# Patient Record
Sex: Female | Born: 2005 | Race: White | Hispanic: No | Marital: Single | Smoking: Never smoker
Health system: Southern US, Community
[De-identification: ages and names within clinical notes are randomized; demographics above are authoritative.]

## PROBLEM LIST (undated history)

## (undated) DIAGNOSIS — K219 Gastro-esophageal reflux disease without esophagitis: Secondary | ICD-10-CM

## (undated) DIAGNOSIS — J45909 Unspecified asthma, uncomplicated: Secondary | ICD-10-CM

## (undated) DIAGNOSIS — R519 Headache, unspecified: Secondary | ICD-10-CM

## (undated) DIAGNOSIS — F32A Depression, unspecified: Secondary | ICD-10-CM

## (undated) DIAGNOSIS — F419 Anxiety disorder, unspecified: Secondary | ICD-10-CM

## (undated) HISTORY — DX: Anxiety disorder, unspecified: F41.9

## (undated) HISTORY — DX: Depression, unspecified: F32.A

## (undated) HISTORY — PX: OTHER SURGICAL HISTORY: SHX169

## (undated) HISTORY — DX: Gastro-esophageal reflux disease without esophagitis: K21.9

## (undated) HISTORY — DX: Unspecified asthma, uncomplicated: J45.909

## (undated) HISTORY — DX: Headache, unspecified: R51.9

---
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
## 2023-01-02 ENCOUNTER — Encounter (INDEPENDENT_AMBULATORY_CARE_PROVIDER_SITE_OTHER): Payer: Self-pay | Admitting: Family

## 2023-04-02 ENCOUNTER — Other Ambulatory Visit (INDEPENDENT_AMBULATORY_CARE_PROVIDER_SITE_OTHER): Payer: Self-pay | Admitting: Family

## 2023-04-02 DIAGNOSIS — G43009 Migraine without aura, not intractable, without status migrainosus: Secondary | ICD-10-CM

## 2023-05-02 ENCOUNTER — Other Ambulatory Visit (INDEPENDENT_AMBULATORY_CARE_PROVIDER_SITE_OTHER): Payer: Self-pay

## 2023-05-02 DIAGNOSIS — G43009 Migraine without aura, not intractable, without status migrainosus: Secondary | ICD-10-CM

## 2023-05-05 ENCOUNTER — Ambulatory Visit (INDEPENDENT_AMBULATORY_CARE_PROVIDER_SITE_OTHER): Payer: PRIVATE HEALTH INSURANCE | Admitting: Family

## 2023-05-05 ENCOUNTER — Encounter (INDEPENDENT_AMBULATORY_CARE_PROVIDER_SITE_OTHER): Payer: Self-pay | Admitting: Family

## 2023-05-05 VITALS — BP 104/74 | HR 84 | Ht 67.13 in | Wt 167.2 lb

## 2023-05-05 DIAGNOSIS — G43009 Migraine without aura, not intractable, without status migrainosus: Secondary | ICD-10-CM

## 2023-05-05 DIAGNOSIS — R42 Dizziness and giddiness: Secondary | ICD-10-CM

## 2023-05-05 DIAGNOSIS — F419 Anxiety disorder, unspecified: Secondary | ICD-10-CM

## 2023-05-05 DIAGNOSIS — G44219 Episodic tension-type headache, not intractable: Secondary | ICD-10-CM | POA: Diagnosis not present

## 2023-05-05 DIAGNOSIS — F32A Depression, unspecified: Secondary | ICD-10-CM

## 2023-05-05 MED ORDER — TOPIRAMATE ER 50 MG PO SPRINKLE CAP24: EXTENDED_RELEASE_CAPSULE | ORAL | 5 refills | Status: DC

## 2023-05-05 NOTE — Patient Instructions (Addendum)
It was a pleasure to see you today!  Instructions for you until your next appointment are as follows: Look up [email or web address] - you will find information about the Emgality medication, patient assistance helps etc If you decide that you want to try Emgality, let me know.  Continue your medications as prescribed for now Work on having salty snacks, increasing water intake and exercise to help with the dizzy episodes Please sign up for MyChart if you have not done so. Please plan to return for follow up in 6 months or sooner if needed.  Feel free to contact our office during normal business hours at [phone] with questions or concerns. If there is no answer or the call is outside business hours, please leave a message and our clinic staff will call you back within the next business day.  If you have an urgent concern, please stay on the line for our after-hours answering service and ask for the on-call neurologist.     I also encourage you to use MyChart to communicate with me more directly. If you have not yet signed up for MyChart within [facility], the front desk staff can help you. However, please note that this inbox is NOT monitored on nights or weekends, and response can take up to 2 business days.  Urgent matters should be discussed with the on-call pediatric neurologist.   [facility], we are committed to providing exceptional care. You will receive a patient satisfaction survey through text or email regarding your visit today. Your opinion is important to me. Comments are appreciated.

## 2023-05-05 NOTE — Progress Notes (Signed)
Brenda Hobbs   MRN:  [id number]  April 28, 2006   Provider: Elveria Rising NP-C Location of Care: [facility]  Visit type: Return visit  Last visit: 12/30/2022  Referral source: Benard Rink, PA-C History from: Epic chart, patient and her mother  Brief history:  Copied from previous record: History of migraine and tension headaches, as well as anxiety and depression. She is followed by a psychiatrist and a psychologist for her mood disorder. She is taking and tolerating Remeron for abdominal pain, and Topiramate ER for migraines. She has Sumatriptan for abortive migraine treatment.   Today's concerns: She reports today that she has had more migraine headaches since her last visit. Her mother believes that they may be triggered by stress of a busy schedule.  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  Brenda Hobbs and her mother are interested in other options for reducing how often migraines occur Brenda Hobbs also reports episodes of dizziness, typically occurring when moving from a sitting to standing position. She reports that she drinks a great deal of water each day. She has not been exercising other than walking to classes on campus.  [name] has been otherwise generally healthy since she was last seen. No health concerns today other than previously mentioned.  Review of systems: Please see HPI for neurologic and other pertinent review of systems. Otherwise all other systems were reviewed and were negative.  Problem List: Patient Active Problem List   Diagnosis Date Noted   Nausea with vomiting 01/06/2021   Abdominal pain 01/06/2021   Migraine without aura and without status migrainosus, not intractable 10/10/2018   Episodic tension-type headache, not intractable 10/10/2018   Anxiety and depression 10/10/2018   Neck  pain, chronic 10/10/2018   Temporomandibular joint dysfunction 10/10/2018     Past Medical History:  Diagnosis Date   Anxiety    Phreesia 10/31/2019   Asthma    Phreesia 10/31/2019   Depression    Phreesia 10/31/2019   GERD (gastroesophageal reflux disease)    Phreesia 10/31/2019   Headache     Past medical history comments: See HPI Copied from previous record: gastroesophageal reflux which often causes her to be nauseated even when she does not have headache. episodes of orthostatic hypotension where she will become lightheaded if she goes suddenly from lying to sitting to standing or sitting to standing  She had 2 episodes in the past that may well have been concussions; the first occurred in 2010, the second occurred in 2012 Birth History 7 lbs. 3 oz. infant born at [redacted] weeks gestational age to a 19 year old g 1 p 0 female. Gestation was complicated by maternal hypotension with near syncope Mother received IV medication pain meds Normal spontaneous vaginal delivery Nursery Course was uncomplicated, she was exclusively breast-fed, had mild jaundice that did not require phototherapy Growth and Development was recalled as  normal   Behavior History Anxiety and depression, in the care of a psychiatrist and psychologist.  Surgical history: Past Surgical History:  Procedure Laterality Date   alopecia areata     WISDOM TOOTH EXTRACTION  04/2020    Family history: family history includes Migraines in her maternal aunt and maternal grandmother.   Social history: Social History   Socioeconomic History   Marital status: Single    Spouse name: Not on file   Number of children:  Not on file   Years of education: Not on file   Highest education level: Not on file  Occupational History   Not on file  Tobacco Use   Smoking status: Never    Passive exposure: Never   Smokeless tobacco: Never  Substance and Sexual Activity   Alcohol use: Not on file   Drug use: Not on file    Sexual activity: Not on file  Other Topics Concern   Not on file  Social History Narrative   [name] is in 12th grade for the 23-24 school year.   She attends [employer].   She is not yet sure what she would like to do after graduation   [name] lives with both parents. She has two brothers.   She enjoys baking, playing the drums and music.   Social Drivers of Corporate investment banker Strain: Not on file  Food Insecurity: Not on file  Transportation Needs: Not on file  Physical Activity: Not on file  Stress: Not on file  Social Connections: Not on file  Intimate Partner Violence: Not on file    Past/failed meds: Copied from previous record: Duloxetine - ineffective   Allergies: Allergies  Allergen Reactions   Omeprazole Rash   Other Hives, Itching and Rash    Tomatoes, cantaloupe, Plums, pears, bananas, chocolate, causes stinging in mouth   Strawberry Extract Hives   Augmentin [Amoxicillin-Pot Clavulanate] Hives and Rash    Immunizations:  There is no immunization history on file for this patient.   Diagnostics/Screenings:  Physical Exam: BP 104/74 (BP Location: Left Arm, Patient Position: Sitting, Cuff Size: Normal)   Pulse 84   Ht 5' 7.13" (1.705 m)   Wt 167 lb 3.2 oz (75.8 kg)   LMP 05/05/2023 (Exact Date)   BMI 26.09 kg/m   General: well developed, well nourished young woman, seated on exam table in no evident distress Head: normocephalic and atraumatic. No dysmorphic features. Neck: supple Musculoskeletal: No skeletal deformities or obvious scoliosis Skin: no rashes or neurocutaneous lesions  Neurologic Exam Mental Status: Awake and fully alert.  Attention span, concentration, and fund of knowledge appropriate for age.  Speech fluent without dysarthria.  Able to follow commands and participate in examination. Cranial Nerves: Turns to localize faces, objects and sounds in the periphery. Facial sensation intact.  Face, tongue, palate move  normally and symmetrically. Motor: Normal functional bulk, tone and strength Sensory: Intact to touch and temperature in all extremities. Coordination: Finger-to-nose and heel-to-shin intact bilaterally. Balance adequate Gait and Station: Arises from chair, without difficulty. Stance is normal.  Gait demonstrates normal stride length and balance.   Impression: Migraine without aura and without status migrainosus, not intractable  Episodic tension-type headache, not intractable  Anxiety and depression  Dizziness   Recommendations for plan of care: The patient's previous Epic records were reviewed. No recent diagnostic studies to be reviewed with the patient. I talked with Brenda and her mother about her headaches and recommended that she consider CGRP medications such as Emgality for migraine prevention. After discussion, she and Mom agreed to research this medication and will let me know if she wants to try it.   For her dizziness, I reminded her of the need to be well hydrated and to have salty snacks during the day to help keep her blood pressure regulated. I also suggested that she ask her psychiatrist about checking her Lamotrigine level.   Plan until next visit: Continue medications as prescribed  Reminded to avoid  skipping meals, to drink plenty of water each day and to get at least 8 hours of sleep each night Call for questions or concerns Return in about 6 months (around 11/03/2023).  The medication list was reviewed and reconciled. No changes were made in the prescribed medications today. A complete medication list was provided to the patient.  Allergies as of 05/05/2023       Reactions   Omeprazole Rash   Other Hives, Itching, Rash   Tomatoes, cantaloupe, Plums, pears, bananas, chocolate, causes stinging in mouth   Strawberry Extract Hives   Augmentin [amoxicillin-pot Clavulanate] Hives, Rash        Medication List        Accurate as of May 05, 2023 11:59 PM.  If you have any questions, ask your nurse or doctor.          albuterol 108 (90 Base) MCG/ACT inhaler Commonly known as: VENTOLIN HFA SMARTSIG:2 Puff(s) By Mouth Every 4-6 Hours PRN   ALBUTEROL IN Inhale into the lungs.   clobetasol 0.05 % external solution Commonly known as: TEMOVATE Apply topically 2 (two) times daily.   Flonase Allergy Relief 50 MCG/ACT nasal spray Generic drug: fluticasone 1 spray in each nostril Nasally Once a day for 30 days   LamoTRIgine 200 MG Tb24 24 hour tablet Take by mouth.   levocetirizine 5 MG tablet Commonly known as: XYZAL Take by mouth.   mirtazapine 7.5 MG tablet Commonly known as: REMERON TAKE 1 TABLET BY MOUTH AT BEDTIME   montelukast 10 MG tablet Commonly known as: SINGULAIR TAKE 1 TABLET BY MOUTH ONCE DAILY FOR 30 DAYS   PROBIOTIC PO Take by mouth.   SUMAtriptan 50 MG tablet Commonly known as: IMITREX Take 1 tablet (50 mg total) by mouth every 2 (two) hours as needed for migraine. May repeat in 2 hours if headache persists or recurs. Do not take more than 2 doses in a 24 hour period   topiramate ER 50 MG Cs24 sprinkle capsule Commonly known as: QUDEXY XR TAKE THREE CAPSULES BY MOUTH ONCE EVERY NIGHT AT BEDTIME      Total time spent with the patient was 25 minutes, of which 50% or more was spent in counseling and coordination of care.  Elveria Rising NP-C [facility] [address], [address] [place], [place] [address] Ph. [phone] Fax [phone]

## 2023-05-06 ENCOUNTER — Encounter (INDEPENDENT_AMBULATORY_CARE_PROVIDER_SITE_OTHER): Payer: Self-pay | Admitting: Family

## 2023-05-06 DIAGNOSIS — R42 Dizziness and giddiness: Secondary | ICD-10-CM | POA: Insufficient documentation

## 2023-11-02 NOTE — Progress Notes (Signed)
 Brenda Hobbs   MRN:  [id number]  01-09-2006   Provider: Ellouise Bollman NP-C Location of Care: [facility]  Visit type: Return visit  Last visit: 05/05/2023  Referral source: Gladis Moats, PA-C History from: Epic chart, patient and her mother  Brief history:  Copied from previous record: History of migraine and tension headaches, as well as anxiety and depression. She is followed by a psychiatrist and a psychologist for her mood disorder. She is taking and tolerating Remeron  for abdominal migraine pain, and Topiramate  ER for migraines. She has Sumatriptan  for abortive migraine treatment.    Today's concerns: She reports today that headaches have not been problematic since her last visit. She is in college and says that things are going well. She is interested in stopping Topiramate  today Anasofia has been taking Lamotrigine for mood disorder but reports today that this has been tapered and discontinued since her last visit.  She asked if I would prescribe Remeron  as she is no longer seeing the pediatric provider that initiated it.  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  Myrle has been otherwise generally healthy since she was last seen. No health concerns today other than previously mentioned.  Review of systems: Please see HPI for neurologic and other pertinent review of systems. Otherwise all other systems were reviewed and were negative.  Problem List: Patient Active Problem List   Diagnosis Date Noted   Dizziness 05/06/2023   Nausea with vomiting 01/06/2021   Abdominal pain 01/06/2021   Migraine without aura and without status migrainosus, not intractable 10/10/2018   Episodic tension-type headache, not intractable 10/10/2018   Anxiety and depression 10/10/2018   Neck pain, chronic 10/10/2018   Temporomandibular joint dysfunction 10/10/2018      Past Medical History:  Diagnosis Date   Anxiety    Phreesia 10/31/2019   Asthma    Phreesia 10/31/2019   Depression    Phreesia 10/31/2019   GERD (gastroesophageal reflux disease)    Phreesia 10/31/2019   Headache     Past medical history comments: See HPI Copied from previous record: gastroesophageal reflux which often causes her to be nauseated even when she does not have headache. episodes of orthostatic hypotension where she will become lightheaded if she goes suddenly from lying to sitting to standing or sitting to standing  She had 2 episodes in the past that may well have been concussions; the first occurred in 2010, the second occurred in 2012 Birth History 7 lbs. 3 oz. infant born at [redacted] weeks gestational age to a 20 year old g 1 p 0 female. Gestation was complicated by maternal hypotension with near syncope Mother received IV medication pain meds Normal spontaneous vaginal delivery Nursery Course was uncomplicated, she was exclusively breast-fed, had mild jaundice that did not require phototherapy Growth and Development was recalled as  normal   Behavior History Anxiety and depression, in the care of a psychiatrist and psychologist.  Surgical history: Past Surgical History:  Procedure Laterality Date   alopecia areata     WISDOM TOOTH EXTRACTION  04/2020     Family history: family history includes Migraines in her maternal aunt and maternal grandmother.   Social history: Social History   Socioeconomic History   Marital status: Single    Spouse name: Not on file   Number of children: Not on file   Years of education: Not on file  Highest education level: Not on file  Occupational History   Not on file  Tobacco Use   Smoking status: Never    Passive exposure: Never   Smokeless tobacco: Never  Substance and Sexual Activity   Alcohol use: Not on file   Drug use: Not on file   Sexual activity: Not on file  Other Topics Concern   Not on file  Social  History Narrative   Freshman in college.    [employer]   Glenda lives with both parents. She has two brothers.   She enjoys baking, playing the drums and music.   Social Drivers of Corporate investment banker Strain: Not on file  Food Insecurity: Not on file  Transportation Needs: Not on file  Physical Activity: Not on file  Stress: Not on file  Social Connections: Not on file  Intimate Partner Violence: Not on file    Past/failed meds: Copied from previous record: Duloxetine - ineffective   Allergies: Allergies  Allergen Reactions   Omeprazole Rash   Other Hives, Itching and Rash    Tomatoes, cantaloupe, Plums, pears, bananas, chocolate, causes stinging in mouth   Strawberry Extract Hives   Augmentin [Amoxicillin-Pot Clavulanate] Hives and Rash    Immunizations:  There is no immunization history on file for this patient.   Diagnostics/Screenings:  Physical Exam: BP 116/78   Pulse 82   Ht 5' 7.5 (1.715 m)   Wt 166 lb 9.6 oz (75.6 kg)   SpO2 97%   BMI 25.71 kg/m   General: Well developed, well nourished adolescent girl, seated on exam table, in no evident distress Head: Head normocephalic and atraumatic.  Oropharynx benign. Neck: Supple Cardiovascular: Regular rate and rhythm, no murmurs Respiratory: Breath sounds clear to auscultation Musculoskeletal: No obvious deformities or scoliosis Skin: No rashes or neurocutaneous lesions  Neurologic Exam Mental Status: Awake and fully alert.  Oriented to place and time.  Recent and remote memory intact.  Attention span, concentration, and fund of knowledge appropriate.  Mood and affect appropriate. Cranial Nerves: Fundoscopic exam reveals sharp disc margins.  Pupils equal, briskly reactive to light.  Extraocular movements full without nystagmus. Hearing intact and symmetric to whisper.  Facial sensation intact.  Face tongue, palate move normally and symmetrically. Shoulder shrug  normal Motor: Normal bulk and tone. Normal strength in all tested extremity muscles. Sensory: Intact to touch and temperature in all extremities.  Coordination: Rapid alternating movements normal in all extremities.  Finger-to-nose and heel-to shin performed accurately bilaterally.  Romberg negative. Gait and Station: Arises from chair without difficulty.  Stance is normal. Gait demonstrates normal stride length and balance.   Able to heel, toe and tandem walk without difficulty.  Impression: Migraine without aura and without status migrainosus, not intractable - Plan: topiramate  ER (QUDEXY  XR) 50 MG CS24 sprinkle capsule  Nausea and vomiting, unspecified vomiting type - Plan: mirtazapine  (REMERON ) 30 MG tablet  Epigastric pain - Plan: mirtazapine  (REMERON ) 30 MG tablet  Episodic tension-type headache, not intractable  Anxiety and depression   Recommendations for plan of care: The patient's previous Epic records were reviewed. No recent diagnostic studies to be reviewed with the patient. I talked with Deandrea and her mother about her headaches. I agree with stopping Topiramate  and gave her instructions on how to do so.  Plan until next visit: Taper instructions for Topiramate  given Remeron  refilled  Call for questions or concerns Return in about 6 months (around 05/04/2024).  The medication list  was reviewed and reconciled. I reviewed the changes that were made in the prescribed medications today. A complete medication list was provided to the patient.  Allergies as of 11/03/2023       Reactions   Omeprazole Rash   Other Hives, Itching, Rash   Tomatoes, cantaloupe, Plums, pears, bananas, chocolate, causes stinging in mouth   Strawberry Extract Hives   Augmentin [amoxicillin-pot Clavulanate] Hives, Rash        Medication List        Accurate as of November 03, 2023 11:59 PM. If you have any questions, ask your nurse or doctor.          STOP taking these medications     clobetasol 0.05 % external solution Commonly known as: TEMOVATE Stopped by: Ellouise Marianna Rankin Allergy Relief 50 MCG/ACT nasal spray Generic drug: fluticasone Stopped by: Ellouise Marianna   LamoTRIgine 200 MG Tb24 24 hour tablet Stopped by: Ellouise Marianna   montelukast 10 MG tablet Commonly known as: SINGULAIR Stopped by: Ellouise Marianna   PROBIOTIC PO Stopped by: Ellouise Marianna       TAKE these medications    albuterol 108 (90 Base) MCG/ACT inhaler Commonly known as: VENTOLIN HFA SMARTSIG:2 Puff(s) By Mouth Every 4-6 Hours PRN   ALBUTEROL IN Inhale into the lungs.   levocetirizine 5 MG tablet Commonly known as: XYZAL Take by mouth.   mirtazapine  30 MG tablet Commonly known as: Remeron  Take 1 tablet (30 mg total) by mouth at bedtime. What changed:  medication strength how much to take Changed by: Ellouise Marianna   SUMAtriptan  50 MG tablet Commonly known as: IMITREX  Take 1 tablet (50 mg total) by mouth every 2 (two) hours as needed for migraine. May repeat in 2 hours if headache persists or recurs. Do not take more than 2 doses in a 24 hour period   topiramate  ER 50 MG Cs24 sprinkle capsule Commonly known as: QUDEXY  XR TAKE TWO CAPSULES BY MOUTH AT BEDTIME FOR THREE DAYS, THEN TAKE 1 CAPSULE BY MOUTH AT BEDTIME FOR THREE DAYS, THEN STOP THE MEDICATION What changed: additional instructions Changed by: Ellouise Marianna      Total time spent with the patient was 30 minutes, of which 50% or more was spent in counseling and coordination of care.  Ellouise Marianna NP-C [facility] [address] [place], [place] [address] Ph. [phone] Fax [phone]

## 2023-11-03 ENCOUNTER — Ambulatory Visit (INDEPENDENT_AMBULATORY_CARE_PROVIDER_SITE_OTHER): Payer: PRIVATE HEALTH INSURANCE | Admitting: Family

## 2023-11-03 ENCOUNTER — Encounter (INDEPENDENT_AMBULATORY_CARE_PROVIDER_SITE_OTHER): Payer: Self-pay | Admitting: Family

## 2023-11-03 VITALS — BP 116/78 | HR 82 | Ht 67.5 in | Wt 166.6 lb

## 2023-11-03 DIAGNOSIS — G44219 Episodic tension-type headache, not intractable: Secondary | ICD-10-CM | POA: Diagnosis not present

## 2023-11-03 DIAGNOSIS — R112 Nausea with vomiting, unspecified: Secondary | ICD-10-CM

## 2023-11-03 DIAGNOSIS — F419 Anxiety disorder, unspecified: Secondary | ICD-10-CM

## 2023-11-03 DIAGNOSIS — G43009 Migraine without aura, not intractable, without status migrainosus: Secondary | ICD-10-CM

## 2023-11-03 DIAGNOSIS — F32A Depression, unspecified: Secondary | ICD-10-CM

## 2023-11-03 DIAGNOSIS — R1013 Epigastric pain: Secondary | ICD-10-CM | POA: Diagnosis not present

## 2023-11-03 MED ORDER — TOPIRAMATE ER 50 MG PO SPRINKLE CAP24: EXTENDED_RELEASE_CAPSULE | ORAL | Status: DC

## 2023-11-03 MED ORDER — MIRTAZAPINE 30 MG PO TABS: 30.0000 mg | ORAL_TABLET | Freq: Every day | ORAL | Status: DC

## 2023-11-03 NOTE — Patient Instructions (Signed)
 It was a pleasure to see you today!  Instructions for you until your next appointment are as follows: Decrease Topiramate  to 2 capsules at bedtime for 3 days, then take 1 capsule at bedtime for 3 days, then stop the medication Let me know when you need the Remeron  refilled Let me know if your headaches become more frequent or more severe Please sign up for MyChart if you have not done so. Please plan to return for follow up in 6 months or sooner if needed.  Feel free to contact our office during normal business hours at [phone] with questions or concerns. If there is no answer or the call is outside business hours, please leave a message and our clinic staff will call you back within the next business day.  If you have an urgent concern, please stay on the line for our after-hours answering service and ask for the on-call neurologist.     I also encourage you to use MyChart to communicate with me more directly. If you have not yet signed up for MyChart within [facility], the front desk staff can help you. However, please note that this inbox is NOT monitored on nights or weekends, and response can take up to 2 business days.  Urgent matters should be discussed with the on-call pediatric neurologist.   At [facility], we are committed to providing exceptional care. You will receive a patient satisfaction survey through text or email regarding your visit today. Your opinion is important to me. Comments are appreciated.

## 2023-11-05 ENCOUNTER — Encounter (INDEPENDENT_AMBULATORY_CARE_PROVIDER_SITE_OTHER): Payer: Self-pay | Admitting: Family

## 2024-01-31 ENCOUNTER — Encounter (INDEPENDENT_AMBULATORY_CARE_PROVIDER_SITE_OTHER): Payer: Self-pay

## 2024-01-31 DIAGNOSIS — G43009 Migraine without aura, not intractable, without status migrainosus: Secondary | ICD-10-CM

## 2024-01-31 MED ORDER — SUMATRIPTAN SUCCINATE 50 MG PO TABS: 50.0000 mg | ORAL_TABLET | ORAL | 3 refills | Status: DC | PRN

## 2024-02-15 ENCOUNTER — Encounter (INDEPENDENT_AMBULATORY_CARE_PROVIDER_SITE_OTHER): Payer: Self-pay

## 2024-02-16 ENCOUNTER — Encounter (INDEPENDENT_AMBULATORY_CARE_PROVIDER_SITE_OTHER): Payer: Self-pay

## 2024-04-24 ENCOUNTER — Encounter (INDEPENDENT_AMBULATORY_CARE_PROVIDER_SITE_OTHER): Payer: Self-pay

## 2024-05-06 NOTE — Progress Notes (Unsigned)
 "  Brenda Hobbs   MRN:  [id number]  [birth year]   Provider: Ellouise Bollman NP-C Location of Care: [facility]  Visit type: Return visit  Last visit: 11/03/2023  Referral source: Gladis Moats, PA-C PCP: Gladis Moats, PA-C History from: Epic chart, patient and her mother  Brief history:  Copied from previous record: History of migraine and tension headaches, as well as anxiety and depression. She is followed by a psychiatrist and a psychologist for her mood disorder. She was prescribed Remeron  for abdominal migraine pain, and Topiramate  ER for migraines. She has Sumatriptan  for abortive migraine treatment.   Since last visit: Brenda Hobbs reports tapering off Topiramate  ER earlier this year without increase in migraine frequency. She recalls 2 migraines that required treatment with Sumatriptan  and sleep. She has near daily tension headaches that are not severe. She manages these with rest, muscle stretches and massage.  Brenda Hobbs reports that her first semester in college went well. 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  She was prescribed Remeron  for abdominal pain and notes that she hasn't needed to take it for some time. She has had a recurrent ear infection since Thanksgiving and awakened again yesterday with pain and ear drainage. She has a scheduled ENT appointment on 05/16/2024 to check her throat for some throat concerns that she has been having. Brenda Hobbs has been otherwise generally healthy since she was last seen. No health concerns today other than previously mentioned.  Review of systems: Please see HPI for neurologic and other pertinent review of systems. Otherwise all other systems were reviewed and were negative.  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 episodes of orthostatic hypotension where she will become lightheaded if she goes suddenly from lying to sitting to standing or sitting to standing  She had 2 episodes in the past that may well have been concussions; the first occurred in 2010, the second occurred in 2012 Birth History 7 lbs. 3 oz. infant born at [redacted] weeks gestational age to a 20 year old g 1 p 0 female. Gestation was complicated by maternal hypotension with near syncope Mother received IV medication pain meds Normal spontaneous vaginal delivery Nursery Course was uncomplicated, she was exclusively breast-fed, had mild jaundice that did not require phototherapy Growth and Development was recalled as  normal   Behavior History Anxiety and depression, in the care of a psychiatrist and psychologist.  Surgical history: Past Surgical History:  Procedure Laterality Date   alopecia areata     WISDOM TOOTH EXTRACTION  04/2020    Family history: family history includes Migraines in her maternal aunt and maternal grandmother.   Social history: Social History  Socioeconomic History   Marital status: Single    Spouse name: Not on file   Number of children: Not on file   Years of education: Not on file   Highest education level: Not on file  Occupational History   Not on file  Tobacco Use   Smoking status:  Never    Passive exposure: Never   Smokeless tobacco: Never  Substance and Sexual Activity   Alcohol use: Not on file   Drug use: Not on file   Sexual activity: Not on file  Other Topics Concern   Not on file  Social History Narrative   Sophomore in college.    [employer]   Brenda Hobbs lives with both parents. She has two brothers.   She enjoys baking, playing the drums and music.   4 dogs 1 cat   Social Drivers of Health   Tobacco Use: Low Risk (11/05/2023)   Patient History    Smoking Tobacco Use: Never    Smokeless Tobacco Use: Never    Passive Exposure: Never  Financial Resource Strain: Not on file  Food Insecurity: Not on file  Transportation Needs: Not on file  Physical Activity: Not on file  Stress: Not on file  Social Connections: Not on file  Intimate Partner Violence: Not on file  Depression (EYV7-0): Not on file  Alcohol Screen: Not on file  Housing: Not on file  Utilities: Not on file  Health Literacy: Not on file    Past/failed meds: Copied from previous record: Duloxetine - ineffective  Topiramate  -   Allergies: Allergies[1]   Immunizations:  There is no immunization history on file for this patient.   Diagnostics/Screenings:  Physical Exam: BP 118/68   Pulse 76   Ht 5' 7.8 (1.722 m)   Wt 165 lb (74.8 kg)   LMP 04/20/2024 (Approximate)   BMI 25.24 kg/m   General: Well developed, well nourished young woman, seated on exam table, in no evident distress Head: Head normocephalic and atraumatic.  Oropharynx benign other than red, irritated with serosanguinous drainage in the right external ear canal and partially occluded from vision but red bulging tympanic membrane on the right. Neck: Supple Cardiovascular: Regular rate and rhythm, no murmurs Respiratory: Breath sounds clear to auscultation Musculoskeletal: No obvious deformities or scoliosis Skin: No rashes or neurocutaneous lesions  Neurologic Exam Mental  Status: Awake and fully alert.  Oriented to place and time. Attention span, concentration, and fund of knowledge appropriate.  Mood and affect appropriate. Cranial Nerves: Fundoscopic exam reveals sharp disc margins.  Pupils equal, briskly reactive to light.  Extraocular movements full without nystagmus. Hearing intact and symmetric to whisper.  Facial sensation intact.  Face tongue, palate move normally and symmetrically. Shoulder shrug normal Motor: Normal bulk and tone. Normal strength in all tested extremity muscles. Sensory: Intact to touch and temperature in all extremities.  Coordination: Rapid alternating movements normal in all extremities.  Finger-to-nose and heel-to shin performed accurately bilaterally.  Gait and Station: Arises from chair without difficulty.  Stance is normal. Gait demonstrates normal stride length and balance.   Able to heel, toe and tandem walk without difficulty.  Impression: Migraine without aura and without status migrainosus, not intractable - Plan: SUMAtriptan  (IMITREX ) 50 MG tablet  Acute otitis media of right ear in pediatric patient - Plan: clindamycin  (CLEOCIN ) 300 MG capsule, neomycin -polymyxin-hydrocortisone (CORTISPORIN) OTIC solution  Acute otitis externa of right ear, unspecified type - Plan: clindamycin  (CLEOCIN ) 300 MG capsule, neomycin -polymyxin-hydrocortisone (CORTISPORIN) OTIC  solution  Episodic tension-type headache, not intractable   Recommendations for plan of care: The patient's previous Epic records were reviewed. No recent diagnostic studies to be reviewed with the patient. I talked with Brenda Hobbs and her mother about her headaches and asked her to let me know if the headaches become more frequent or more severe. I reminded Brenda Hobbs that there is no specific treatment for tension headaches and recommended that she continue lifestyle measures for these.  For her right ear, because it is close to the holiday, I prescribed Clindamycin  and  Cortisporin otic drops. I cautioned her about potential for allergic reaction as well as stomach upset, and instructed her to be sure to keep the appointment with her ENT next week.  Recommendations and plan until next visit: Start Clindamycin  and Cortisporin drops as prescribed Be sure to keep upcoming ENT appointment Let me know if headaches become more frequent or more severe Call for questions or concerns Return in about 6 months (around 11/05/2024).  The medication list was reviewed and reconciled. I reviewed the changes that were made in the prescribed medications today. A complete medication list was provided to the patient.  Allergies as of 05/07/2024       Reactions   Omeprazole Rash   Other Hives, Itching, Rash   Tomatoes, cantaloupe, Plums, pears, bananas, chocolate, causes stinging in mouth   Strawberry Extract Hives   Augmentin [amoxicillin-pot Clavulanate] Hives, Rash        Medication List        Accurate as of May 07, 2024 10:46 AM. If you have any questions, ask your nurse or doctor.          STOP taking these medications    levocetirizine 5 MG tablet Commonly known as: XYZAL Stopped by: Ellouise Bollman, NP   mirtazapine  30 MG tablet Commonly known as: Remeron  Stopped by: Ellouise Bollman, NP   topiramate  ER 50 MG Cs24 sprinkle capsule Commonly known as: QUDEXY  XR Stopped by: Ellouise Bollman, NP       TAKE these medications    albuterol 108 (90 Base) MCG/ACT inhaler Commonly known as: VENTOLIN HFA SMARTSIG:2 Puff(s) By Mouth Every 4-6 Hours PRN   ALBUTEROL IN Inhale into the lungs.   clindamycin  300 MG capsule Commonly known as: CLEOCIN  Take 1 capsule (300 mg total) by mouth 3 (three) times daily for 10 days. Started by: Ellouise Bollman, NP   neomycin -polymyxin-hydrocortisone OTIC solution Commonly known as: CORTISPORIN Place 3 drops into the right ear 3 (three) times daily for 10 days. Started by: Ellouise Bollman, NP    SUMAtriptan  50 MG tablet Commonly known as: IMITREX  Take 1 tablet (50 mg total) by mouth every 2 (two) hours as needed for migraine. May repeat in 2 hours if headache persists or recurs. Do not take more than 2 doses in a 24 hour period      I spent 30 minutes caring for the patient today face to face reviewing records, including previous charts and test results, examination of the patient, discussion and education with the parent and her mother about her condition, documentation in her chart, developing a plan of care and ordering prescriptions.  Ellouise Bollman NP-C [facility] [address], [address] [place], [place] [address] Ph. [phone] Fax [phone]           [1]  Allergies Allergen Reactions   Omeprazole Rash   Other Hives, Itching and Rash    Tomatoes, cantaloupe, Plums, pears, bananas,  chocolate, causes stinging in mouth   Strawberry Extract Hives   Augmentin [Amoxicillin-Pot Clavulanate] Hives and Rash   "

## 2024-05-07 ENCOUNTER — Encounter (INDEPENDENT_AMBULATORY_CARE_PROVIDER_SITE_OTHER): Payer: Self-pay | Admitting: Family

## 2024-05-07 ENCOUNTER — Ambulatory Visit (INDEPENDENT_AMBULATORY_CARE_PROVIDER_SITE_OTHER): Payer: PRIVATE HEALTH INSURANCE | Admitting: Family

## 2024-05-07 VITALS — BP 118/68 | HR 76 | Ht 67.8 in | Wt 165.0 lb

## 2024-05-07 DIAGNOSIS — G44219 Episodic tension-type headache, not intractable: Secondary | ICD-10-CM

## 2024-05-07 DIAGNOSIS — G43009 Migraine without aura, not intractable, without status migrainosus: Secondary | ICD-10-CM

## 2024-05-07 DIAGNOSIS — H6691 Otitis media, unspecified, right ear: Secondary | ICD-10-CM | POA: Diagnosis not present

## 2024-05-07 DIAGNOSIS — H60501 Unspecified acute noninfective otitis externa, right ear: Secondary | ICD-10-CM | POA: Diagnosis not present

## 2024-05-07 MED ORDER — SUMATRIPTAN SUCCINATE 50 MG PO TABS: 50.0000 mg | ORAL_TABLET | ORAL | 3 refills | Status: AC | PRN

## 2024-05-07 MED ORDER — CLINDAMYCIN HCL 300 MG PO CAPS: 300.0000 mg | ORAL_CAPSULE | Freq: Three times a day (TID) | ORAL | 0 refills | Status: AC

## 2024-05-07 MED ORDER — NEOMYCIN-POLYMYXIN-HC 3.5-10000-1 OT SOLN: 3.0000 [drp] | Freq: Three times a day (TID) | OTIC | 0 refills | Status: AC

## 2024-05-07 NOTE — Patient Instructions (Addendum)
 It was a pleasure to see you today!  Instructions for you until your next appointment are as follows: Start Clindamycin  300mg  - 1 capsule every 8 hours for 10 days Start Cortisporin ear drops - 3 drops in the right ear every 8 hours for 10 days Be sure to see your ENT as scheduled next week If you develop rash or any other side effect from the antibiotic, stop it and go to ER or Urgent Care as needed.  Antibiotics can irritate the stomach. Be sure to take it with food. You may also want to take a probiotic such as Culturelle to prevent cramping and diarrhea.  Let me know if your headaches become more frequent or more severe Please sign up for MyChart if you have not done so. Please plan to return for follow up in 6 months or sooner if needed.  Feel free to contact our office during normal business hours at [phone] with questions or concerns. If there is no answer or the call is outside business hours, please leave a message and our clinic staff will call you back within the next business day.  If you have an urgent concern, please stay on the line for our after-hours answering service and ask for the on-call neurologist.     I also encourage you to use MyChart to communicate with me more directly. If you have not yet signed up for MyChart within [facility], the front desk staff can help you. However, please note that this inbox is NOT monitored on nights or weekends, and response can take up to 2 business days.  Urgent matters should be discussed with the on-call pediatric neurologist.   At [facility], we are committed to providing exceptional care. You will receive a patient satisfaction survey through text or email regarding your visit today. Your opinion is important to me. Comments are appreciated.

## 2024-05-16 ENCOUNTER — Ambulatory Visit (INDEPENDENT_AMBULATORY_CARE_PROVIDER_SITE_OTHER): Payer: PRIVATE HEALTH INSURANCE | Admitting: Physician Assistant

## 2024-05-16 ENCOUNTER — Encounter (INDEPENDENT_AMBULATORY_CARE_PROVIDER_SITE_OTHER): Payer: Self-pay | Admitting: Physician Assistant

## 2024-05-16 VITALS — BP 124/71 | HR 74 | Temp 97.6°F | Ht 67.5 in | Wt 160.0 lb

## 2024-05-16 DIAGNOSIS — H6091 Unspecified otitis externa, right ear: Secondary | ICD-10-CM

## 2024-05-16 DIAGNOSIS — H9201 Otalgia, right ear: Secondary | ICD-10-CM

## 2024-05-16 DIAGNOSIS — R490 Dysphonia: Secondary | ICD-10-CM

## 2024-05-16 NOTE — Progress Notes (Signed)
 Dear Dr. Waddell, Here is my assessment for our mutual patient, Brenda Hobbs. Thank you for allowing me the opportunity to care for your patient. Please do not hesitate to contact me should you have any other questions. Sincerely, Brenda Cohen PA-C  Otolaryngology Clinic Note Referring provider: Dr. Waddell HPI:  Brenda Hobbs is a 18 y.o. female kindly referred by Dr. Waddell   Discussed the use of AI scribe software for clinical note transcription with the patient, who gave verbal consent to proceed.  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  She developed acute severe right otalgia over Thanksgiving, described as intense and worsened by palpation, associated with dizziness, otorrhea, and transient hearing loss for approximately three days. She denied fever. Initial treatment with clindamycin  was ineffective, followed by a five-day course of doxycycline. Ear drops were prescribed but not used after a second provider advised against them. Symptoms improved for four to five days, then recurred over Christmas. She received a six-day course of steroids from a dentist, but otalgia persisted. A neurologist subsequently prescribed a new antibiotic (possibly starting with 'A') and new ear drops, resulting in significant improvement within 24 hours. She is currently completing this antibiotic course. She has no history of recurrent childhood otitis media. Currently, she denies otalgia and her hearing has returned to baseline.  Since her junior year of high school, she has experienced recurrent throat and neck muscle pain, particularly during periods of increased vocal use. As a college music major, she sings frequently, including in choir and musical theater. This semester, singing in a lower register led to a different quality of throat  pain, described as a sensation of constriction and muscle discomfort rather than odynophagia. The pain is exacerbated by both singing and speaking, and worsens with increased vocal activity. Over the recent break, even conversational speech triggered throat pain and fatigue. She denies current throat pain today, attributing this to minimal vocal use. She experiences muscular tension in her neck and is described as hypermobile. She denies current neck soreness.           Independent Review of Additional Tests or Records:  Neurology office visit note 05/05/2024   PMH/Meds/All/SocHx/FamHx/ROS:   Past Medical History:  Diagnosis Date   Anxiety    Phreesia 10/31/2019   Asthma    Phreesia 10/31/2019   Depression    Phreesia 10/31/2019   GERD (gastroesophageal reflux disease)    Phreesia 10/31/2019   Headache      Past Surgical History:  Procedure Laterality Date   alopecia areata     WISDOM TOOTH EXTRACTION  04/2020    Family History  Problem Relation Age of Onset   Migraines Maternal Aunt    Migraines Maternal Grandmother      Social Connections: Not on file     Current Medications[1]   Physical Exam:   BP 124/71   Pulse 74   Temp 97.6 F (36.4 C)   Ht 5' 7.5 (1.715 m)   Wt 160 lb (72.6 kg)   LMP 04/20/2024 (Approximate)   SpO2 99%   BMI 24.69 kg/m   Pertinent Findings  CN II-XII grossly intact Bilateral EAC clear and TM intact with well pneumatized middle ear spaces, slight moisture in the right EAC, no erythema or purulence Anterior rhinoscopy: Septum midline; bilateral inferior turbinates with no hypertrophy No lesions of  oral cavity/oropharynx; dentition in normal limits No obviously palpable neck masses/lymphadenopathy/thyromegaly No respiratory distress or stridor       Seprately Identifiable Procedures:  None  Impression & Plans:  [name] is a 18 y.o. female with the following   Assessment and Plan    Otitis externa, right  ear -Question otitis externa versus otitis media.  Uncertain etiology as she had both drainage and was told it was otitis media.  In the event it is resolved, hearing is normal no signs of infection. - Discontinued topical ear drops to reduce risk of secondary fungal infection. - Provided education on differentiating otitis externa from otitis media and expected treatment responses. - Advised to avoid insertion of objects into the external auditory canal and to keep the ear dry. - Instructed to seek care for recurrence of otalgia, otorrhea, or hearing loss. - Encouraged follow-up in clinic for persistent or recurrent otologic symptoms when back in town.  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 No persistent dysphonia or concerning features for neoplasm or structural pathology. Symptoms improve with vocal rest, indicating functional etiology. - Recommended evaluation by laryngologist at [facility] for specialized assessment and management. - Discussed potential benefit of voice therapy for muscle tension dysphonia, to be coordinated through a laryngologist or voice center. - Encouraged communication with music program and instructors to facilitate referral to appropriate voice specialists if symptoms persist or worsen. - Advised on importance of vocal rest and proper vocal technique to prevent further strain.           - f/u PRN   Thank you for allowing me the opportunity to care for your patient. Please do not hesitate to contact me should you have any other questions.  Sincerely, Brenda Cohen PA-C [facility] Phone: [phone] Fax: [phone]  05/16/2024, 3:29 PM        [1]  Current Outpatient Medications:    albuterol (VENTOLIN HFA) 108 (90 Base) MCG/ACT inhaler, SMARTSIG:2 Puff(s) By Mouth Every 4-6 Hours PRN, Disp: , Rfl:    ALBUTEROL IN, Inhale into the  lungs., Disp: , Rfl:    SUMAtriptan  (IMITREX ) 50 MG tablet, Take 1 tablet (50 mg total) by mouth every 2 (two) hours as needed for migraine. May repeat in 2 hours if headache persists or recurs. Do not take more than 2 doses in a 24 hour period, Disp: 9 tablet, Rfl: 3   clindamycin  (CLEOCIN ) 300 MG capsule, Take 1 capsule (300 mg total) by mouth 3 (three) times daily for 10 days. (Patient not taking: Reported on 05/16/2024), Disp: 30 capsule, Rfl: 0   neomycin -polymyxin-hydrocortisone (CORTISPORIN) OTIC solution, Place 3 drops into the right ear 3 (three) times daily for 10 days. (Patient not taking: Reported on 05/16/2024), Disp: 10 mL, Rfl: 0

## 2024-06-15 ENCOUNTER — Encounter (INDEPENDENT_AMBULATORY_CARE_PROVIDER_SITE_OTHER): Payer: Self-pay | Admitting: Physician Assistant

## 2024-06-15 ENCOUNTER — Ambulatory Visit (INDEPENDENT_AMBULATORY_CARE_PROVIDER_SITE_OTHER): Payer: PRIVATE HEALTH INSURANCE | Admitting: Physician Assistant

## 2024-06-15 VITALS — BP 113/76 | HR 62 | Temp 97.4°F

## 2024-06-15 DIAGNOSIS — H6122 Impacted cerumen, left ear: Secondary | ICD-10-CM

## 2024-06-15 DIAGNOSIS — H60502 Unspecified acute noninfective otitis externa, left ear: Secondary | ICD-10-CM

## 2024-06-19 ENCOUNTER — Telehealth (INDEPENDENT_AMBULATORY_CARE_PROVIDER_SITE_OTHER): Payer: Self-pay | Admitting: Physician Assistant

## 2024-06-19 NOTE — Telephone Encounter (Signed)
 Patient's mother, Asja Frommer, called and left a voicemail message today at 12:28pm wanting to know the status of the medication that was supposed to be ordered on 06/15/2024 when the patient saw Chyrl Cohen, PA-C.  She stated that she filled out a paper for the ear powder and anti fungal to be sent to be ordered and she has not been contacted yet.  Please call her at [phone].

## 2024-06-19 NOTE — Telephone Encounter (Signed)
 error

## 2024-06-20 ENCOUNTER — Telehealth (INDEPENDENT_AMBULATORY_CARE_PROVIDER_SITE_OTHER): Payer: Self-pay

## 2024-06-20 NOTE — Progress Notes (Signed)
 Dear Dr. Gladis, Here is my assessment for our mutual patient, Brenda Hobbs. Thank you for allowing me the opportunity to care for your patient. Please do not hesitate to contact me should you have any other questions. Sincerely, Brenda Cohen PA-C  Otolaryngology Clinic Note Referring provider: Dr. Gladis HPI:  Brenda Hobbs is a 19 y.o. female kindly referred by Dr. Gladis   Discussed the use of AI scribe software for clinical note transcription with the patient, who gave verbal consent to proceed.  History of Present Illness    Brenda Hobbs is a 19 year old female with recurrent left otitis externa who presents with left ear pain and drainage.  She was last seen in the office on 05/16/2024.  Below is a recap of the encounter.  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   She developed acute severe right otalgia over Thanksgiving, described as intense and worsened by palpation, associated with dizziness, otorrhea, and transient hearing loss for approximately three days. She denied fever. Initial treatment with clindamycin  was ineffective, followed by a five-day course of doxycycline. Ear drops were prescribed but not used after a second provider advised against them. Symptoms improved for four to five days, then recurred over Christmas. She received a six-day course of steroids from a dentist, but otalgia persisted. A neurologist subsequently prescribed a new antibiotic (possibly starting with 'A') and new ear drops, resulting in significant improvement within 24 hours. She is currently completing this antibiotic course. She has no history of recurrent childhood otitis media. Currently, she denies otalgia and her hearing has returned to baseline.   Since her junior year of high school, she has experienced recurrent throat and neck muscle pain,  particularly during periods of increased vocal use. As a college music major, she sings frequently, including in choir and musical theater. This semester, singing in a lower register led to a different quality of throat pain, described as a sensation of constriction and muscle discomfort rather than odynophagia. The pain is exacerbated by both singing and speaking, and worsens with increased vocal activity. Over the recent break, even conversational speech triggered throat pain and fatigue. She denies current throat pain today, attributing this to minimal vocal use. She experiences muscular tension in her neck and is described as hypermobile. She denies current neck soreness.      Update 06/15/2024  She developed left ear pain on Thursday, initially mild but progressively worsening. The pain was similar to a previous episode earlier in the year, accompanied by neuralgia and otorrhea from the left ear. Although less severe than the initial episode, the pain became significant enough to prompt intervention.  She instilled a home remedy of rubbing alcohol and white vinegar into the left ear several times, resulting in near-complete resolution of pain. Following treatment, she continued to experience a sensation of cerumen buildup and crusting in the ear, but denied ongoing pain or otorrhea at the time of the visit. She noted that otorrhea was present prior to the home remedy but resolved after its use. She currently describes her ear as 'back to normal' except for residual cerumen accumulation.  She denies use of other substances or rinses in her ear prior to this episode and does not routinely instill anything in her ears. She occasionally uses saline spray on her piercings but avoids contact with the ear canal. She denies diabetes. This is  her second episode of similar symptoms in the past two months. She denies current ear pain or drainage.           Independent Review of Additional Tests or Records:   None   PMH/Meds/All/SocHx/FamHx/ROS:   Past Medical History:  Diagnosis Date   Anxiety    [employer] 10/31/2019   Asthma    [employer] 10/31/2019   Depression    [employer] 10/31/2019   GERD (gastroesophageal reflux disease)    [employer] 10/31/2019   Headache      Past Surgical History:  Procedure Laterality Date   alopecia areata     WISDOM TOOTH EXTRACTION  04/2020    Family History  Problem Relation Age of Onset   Migraines Maternal Aunt    Migraines Maternal Grandmother      Social Connections: Not on file     Current Medications[1]   Physical Exam:   BP 113/76   Pulse 62   Temp (!) 97.4 F (36.3 C)   SpO2 98%   Pertinent Findings  CN II-XII grossly intact Wet cerumen impaction in the left EAC, once removed spores noted throughout the canal with some minimal drainage, minimal erythema of the canal, TM intact, right EAC normal the right TM intact No obviously palpable neck masses/lymphadenopathy/thyromegaly No respiratory distress or stridor   Seprately Identifiable Procedures:  ThisProcedure: bilateral ear microscopy and cerumen removal using microscope (CPT [phone]) - Mod 25 Pre-procedure diagnosis: unilateral cerumen impaction left external auditory canal Post-procedure diagnosis: same Indication: unilateral  cerumen impaction; given patient's otologic complaints and history as well as for improved and comprehensive examination of external ear and tympanic membrane, bilateral otologic examination using microscope was performed and impacted cerumen removed  Procedure: Patient was placed semi-recumbent. Both ear canals were examined using the microscope with findings above. Cerumen removed from the left external auditory canal using suction and currette with improvement in EAC examination and patency. Left: EAC was patent. TM was intact . Middle ear was aerated. Drainage: minimal  Right: EAC was patent. TM was intact . Middle ear was aerated . Drainage:  none Patient tolerated the procedure well.   Impression & Plans:  [name] is a 19 y.o. 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  - Applied CASH powder to the external auditory canal. - Advised avoidance of alcohol instillation in the ear. - Fax sent to compounding pharmacy  - Patient is visiting from out of town, have recommended she follow-up with an ENT specialist in [place] in the next 1 to 2 weeks          - f/u 1 to 2 weeks with ENT   Thank you for allowing me the opportunity to care for your patient. Please do not hesitate to contact me should you have any other questions.  Sincerely, Brenda Cohen PA-C [facility] Phone: [phone] Fax: [phone]  06/20/2024, 11:05 AM        [1]  Current Outpatient Medications:    albuterol (VENTOLIN HFA) 108 (90 Base) MCG/ACT inhaler, SMARTSIG:2 Puff(s) By Mouth Every 4-6 Hours PRN, Disp: , Rfl:    ALBUTEROL IN, Inhale into the lungs., Disp: , Rfl:    SUMAtriptan  (IMITREX ) 50 MG tablet, Take 1 tablet (50 mg total) by mouth every 2 (two) hours as needed for migraine. May repeat in 2 hours if headache persists or recurs. Do not take  more than 2 doses in a 24 hour period, Disp: 9 tablet, Rfl: 3

## 2024-06-20 NOTE — Telephone Encounter (Signed)
 Spoke to patient and let her know that the form was faxed on 06/20/2023 out of mistake that I missed faxing it. Patient understood. I called the pharmacy and they are waiting for payment to ship the medication. I called the patient back and let her know and gave her their number to speak with them about payment.

## 2024-11-05 ENCOUNTER — Ambulatory Visit (INDEPENDENT_AMBULATORY_CARE_PROVIDER_SITE_OTHER): Payer: Self-pay | Admitting: Family
